# Patient Record
Sex: Female | Born: 1970 | Hispanic: Yes | Marital: Married | Smoking: Never smoker
Health system: Southern US, Community
[De-identification: ages and names within clinical notes are randomized; demographics above are authoritative.]

## PROBLEM LIST (undated history)

## (undated) DIAGNOSIS — E785 Hyperlipidemia, unspecified: Secondary | ICD-10-CM

## (undated) HISTORY — DX: Hyperlipidemia, unspecified: E78.5

---

## 2000-02-02 ENCOUNTER — Emergency Department (HOSPITAL_COMMUNITY): Admission: EM | Admit: 2000-02-02 | Discharge: 2000-02-03 | Payer: Self-pay

## 2000-11-13 ENCOUNTER — Ambulatory Visit (HOSPITAL_COMMUNITY): Admission: RE | Admit: 2000-11-13 | Discharge: 2000-11-13 | Payer: Self-pay | Admitting: *Deleted

## 2002-08-24 ENCOUNTER — Encounter (INDEPENDENT_AMBULATORY_CARE_PROVIDER_SITE_OTHER): Payer: Self-pay | Admitting: *Deleted

## 2002-08-24 LAB — CONVERTED CEMR LAB

## 2002-08-28 ENCOUNTER — Encounter: Admission: RE | Admit: 2002-08-28 | Discharge: 2002-08-28 | Payer: Self-pay | Admitting: Family Medicine

## 2002-09-04 ENCOUNTER — Encounter: Admission: RE | Admit: 2002-09-04 | Discharge: 2002-09-04 | Payer: Self-pay | Admitting: Family Medicine

## 2002-09-12 ENCOUNTER — Ambulatory Visit (HOSPITAL_COMMUNITY): Admission: RE | Admit: 2002-09-12 | Discharge: 2002-09-12 | Payer: Self-pay | Admitting: *Deleted

## 2002-10-15 ENCOUNTER — Encounter: Admission: RE | Admit: 2002-10-15 | Discharge: 2002-10-15 | Payer: Self-pay | Admitting: Family Medicine

## 2002-11-14 ENCOUNTER — Encounter: Admission: RE | Admit: 2002-11-14 | Discharge: 2002-11-14 | Payer: Self-pay | Admitting: Family Medicine

## 2002-12-13 ENCOUNTER — Encounter: Admission: RE | Admit: 2002-12-13 | Discharge: 2002-12-13 | Payer: Self-pay | Admitting: Family Medicine

## 2003-01-02 ENCOUNTER — Encounter: Admission: RE | Admit: 2003-01-02 | Discharge: 2003-01-02 | Payer: Self-pay | Admitting: Sports Medicine

## 2003-01-14 ENCOUNTER — Encounter: Admission: RE | Admit: 2003-01-14 | Discharge: 2003-01-14 | Payer: Self-pay | Admitting: Family Medicine

## 2003-01-19 ENCOUNTER — Inpatient Hospital Stay (HOSPITAL_COMMUNITY): Admission: AD | Admit: 2003-01-19 | Discharge: 2003-01-21 | Payer: Self-pay | Admitting: Family Medicine

## 2006-06-23 ENCOUNTER — Encounter (INDEPENDENT_AMBULATORY_CARE_PROVIDER_SITE_OTHER): Payer: Self-pay | Admitting: *Deleted

## 2008-10-03 ENCOUNTER — Encounter: Payer: Self-pay | Admitting: Family Medicine

## 2008-10-03 ENCOUNTER — Ambulatory Visit (HOSPITAL_COMMUNITY): Admission: RE | Admit: 2008-10-03 | Discharge: 2008-10-03 | Payer: Self-pay | Admitting: Family Medicine

## 2009-01-15 ENCOUNTER — Inpatient Hospital Stay (HOSPITAL_COMMUNITY): Admission: AD | Admit: 2009-01-15 | Discharge: 2009-01-15 | Payer: Self-pay | Admitting: Obstetrics & Gynecology

## 2009-02-16 ENCOUNTER — Inpatient Hospital Stay (HOSPITAL_COMMUNITY): Admission: AD | Admit: 2009-02-16 | Discharge: 2009-02-16 | Payer: Self-pay | Admitting: Obstetrics & Gynecology

## 2009-02-16 ENCOUNTER — Ambulatory Visit: Payer: Self-pay | Admitting: Family Medicine

## 2009-02-26 ENCOUNTER — Inpatient Hospital Stay (HOSPITAL_COMMUNITY): Admission: AD | Admit: 2009-02-26 | Discharge: 2009-02-27 | Payer: Self-pay | Admitting: Obstetrics & Gynecology

## 2009-02-26 ENCOUNTER — Ambulatory Visit: Payer: Self-pay | Admitting: Obstetrics and Gynecology

## 2010-07-28 LAB — CBC
Hemoglobin: 10.8 g/dL — ABNORMAL LOW (ref 12.0–15.0)
Hemoglobin: 12.7 g/dL (ref 12.0–15.0)
MCV: 85 fL (ref 78.0–100.0)
RBC: 3.79 MIL/uL — ABNORMAL LOW (ref 3.87–5.11)
RBC: 4.59 MIL/uL (ref 3.87–5.11)
RDW: 15.6 % — ABNORMAL HIGH (ref 11.5–15.5)
WBC: 11.2 10*3/uL — ABNORMAL HIGH (ref 4.0–10.5)
WBC: 11.4 10*3/uL — ABNORMAL HIGH (ref 4.0–10.5)

## 2010-07-30 LAB — GC/CHLAMYDIA PROBE AMP, GENITAL: Chlamydia, DNA Probe: NEGATIVE

## 2010-07-30 LAB — STREP B DNA PROBE

## 2010-07-30 LAB — URINALYSIS, ROUTINE W REFLEX MICROSCOPIC
Bilirubin Urine: NEGATIVE
Nitrite: NEGATIVE
Specific Gravity, Urine: <1.005 — ABNORMAL LOW (ref 1.005–1.030)
pH: 6.5 (ref 5.0–8.0)

## 2010-07-30 LAB — WET PREP, GENITAL: Yeast Wet Prep HPF POC: NONE SEEN

## 2010-07-30 LAB — FETAL FIBRONECTIN: Fetal Fibronectin: NEGATIVE

## 2012-07-13 ENCOUNTER — Encounter (HOSPITAL_COMMUNITY): Payer: Self-pay

## 2014-09-24 ENCOUNTER — Other Ambulatory Visit (HOSPITAL_COMMUNITY): Payer: Self-pay | Admitting: Nurse Practitioner

## 2014-09-24 DIAGNOSIS — Z1231 Encounter for screening mammogram for malignant neoplasm of breast: Secondary | ICD-10-CM

## 2014-10-06 ENCOUNTER — Ambulatory Visit (HOSPITAL_COMMUNITY)
Admission: RE | Admit: 2014-10-06 | Discharge: 2014-10-06 | Disposition: A | Discharge status: Home / Self Care | Payer: Self-pay | Source: Ambulatory Visit | Attending: Nurse Practitioner | Admitting: Nurse Practitioner

## 2014-10-06 DIAGNOSIS — Z1231 Encounter for screening mammogram for malignant neoplasm of breast: Secondary | ICD-10-CM

## 2015-11-11 ENCOUNTER — Other Ambulatory Visit: Payer: Self-pay | Admitting: Nurse Practitioner

## 2015-11-11 DIAGNOSIS — Z1231 Encounter for screening mammogram for malignant neoplasm of breast: Secondary | ICD-10-CM

## 2015-11-12 ENCOUNTER — Ambulatory Visit
Admission: RE | Admit: 2015-11-12 | Discharge: 2015-11-12 | Disposition: A | Discharge status: Home / Self Care | Payer: No Typology Code available for payment source | Source: Ambulatory Visit | Attending: Nurse Practitioner | Admitting: Nurse Practitioner

## 2015-11-12 DIAGNOSIS — Z1231 Encounter for screening mammogram for malignant neoplasm of breast: Secondary | ICD-10-CM

## 2016-09-30 ENCOUNTER — Other Ambulatory Visit: Payer: Self-pay | Admitting: Nurse Practitioner

## 2016-09-30 DIAGNOSIS — Z1231 Encounter for screening mammogram for malignant neoplasm of breast: Secondary | ICD-10-CM

## 2016-11-17 ENCOUNTER — Ambulatory Visit
Admission: RE | Admit: 2016-11-17 | Discharge: 2016-11-17 | Disposition: A | Discharge status: Home / Self Care | Payer: No Typology Code available for payment source | Source: Ambulatory Visit | Attending: Nurse Practitioner | Admitting: Nurse Practitioner

## 2016-11-17 DIAGNOSIS — Z1231 Encounter for screening mammogram for malignant neoplasm of breast: Secondary | ICD-10-CM

## 2017-10-10 ENCOUNTER — Other Ambulatory Visit: Payer: Self-pay | Admitting: Nurse Practitioner

## 2017-10-10 DIAGNOSIS — Z1231 Encounter for screening mammogram for malignant neoplasm of breast: Secondary | ICD-10-CM

## 2017-11-10 ENCOUNTER — Other Ambulatory Visit: Payer: Self-pay | Admitting: Obstetrics and Gynecology

## 2017-11-10 DIAGNOSIS — Z1231 Encounter for screening mammogram for malignant neoplasm of breast: Secondary | ICD-10-CM

## 2017-11-16 ENCOUNTER — Ambulatory Visit: Payer: Self-pay | Admitting: Internal Medicine

## 2017-12-12 ENCOUNTER — Encounter (HOSPITAL_COMMUNITY): Payer: Self-pay

## 2017-12-12 ENCOUNTER — Ambulatory Visit
Admission: RE | Admit: 2017-12-12 | Discharge: 2017-12-12 | Disposition: A | Discharge status: Home / Self Care | Payer: No Typology Code available for payment source | Source: Ambulatory Visit | Attending: Obstetrics and Gynecology | Admitting: Obstetrics and Gynecology

## 2017-12-12 ENCOUNTER — Ambulatory Visit (HOSPITAL_COMMUNITY)
Admission: RE | Admit: 2017-12-12 | Discharge: 2017-12-12 | Disposition: A | Discharge status: Home / Self Care | Payer: No Typology Code available for payment source | Source: Ambulatory Visit | Attending: Obstetrics and Gynecology | Admitting: Obstetrics and Gynecology

## 2017-12-12 VITALS — BP 118/72 | Ht 59.0 in | Wt 136.0 lb

## 2017-12-12 DIAGNOSIS — Z01419 Encounter for gynecological examination (general) (routine) without abnormal findings: Secondary | ICD-10-CM

## 2017-12-12 DIAGNOSIS — Z1231 Encounter for screening mammogram for malignant neoplasm of breast: Secondary | ICD-10-CM

## 2017-12-12 NOTE — Progress Notes (Signed)
No complaints today.   Pap Smear: Pap smear completed today. Last Pap [facility] and normal. Per patient has no history of an abnormal Pap smear. Last Pap smear result is in Epic.  Physical exam: Breasts Breasts symmetrical. No skin abnormalities bilateral breasts. No nipple retraction bilateral breasts. No nipple discharge bilateral breasts. No lymphadenopathy. No lumps palpated bilateral breasts. No complaints of pain or tenderness on exam. [facility] for [facility] mamm[facility]. Appointment scheduled for Tuesday, December 12, 2017 at 1110.        Pelvic/Bimanual   Ext Genitalia No lesions, no swelling and no discharge observed on external genitalia.         Vagina Vagina pink and normal texture. No lesions or discharge observed in vagina.          Cervix Cervix is present. Cervix pink and of normal texture. No discharge observed.     Uterus Uterus is present and palpable. Uterus in normal position and normal size.        Adnexae Bilateral ovaries present and palpable. No tenderness on palpation.         Rectovaginal No rectal exam completed today since patient had no rectal complaints. No skin abnormalities observed on exam.    Smoking History: Patient has never smoked.  Patient Navigation: Patient education provided. Access to services provided for patient through [facility] program.   Breast and Cervical Cancer Risk Assessment: Patient has no family history of breast cancer, known genetic mutations, or radiation treatment to the chest before age 45. Patient has no history of cervical dysplasia, immunocompromised, or DES exposure in-utero.  Risk Assessment    Risk Scores      12/12/2017   Last edited by: Priscille HeidelbergBrannock, Shaquela Weichert P, RN   5-year risk: 0.5 %   Lifetime risk: 6.2 %

## 2017-12-12 NOTE — Patient Instructions (Signed)
Explained breast self awareness with Missy Sabinsosa H Wallen. Let patient know [facility] will cover Pap smears and HPV typing every 5 years unless has a history of abnormal Pap smears. [facility]. Appointment scheduled for Tuesday, December 12, 2017 at 1110. Let patient know will follow up with her within the next couple weeks with results of Pap smear by letter or phone. Informed patient the the Breast Center will follow-up with her within the next couple of weeks with results of mammogram by letter or phone. [employer] verbalized understanding.  Takerra Lupinacci, Kathaleen Maserhristine Poll, RN 1:00 PM

## 2017-12-13 ENCOUNTER — Encounter (HOSPITAL_COMMUNITY): Payer: Self-pay | Admitting: *Deleted

## 2017-12-14 LAB — CYTOLOGY - PAP
DIAGNOSIS: NEGATIVE
HPV: NOT DETECTED

## 2017-12-15 ENCOUNTER — Ambulatory Visit: Payer: No Typology Code available for payment source

## 2017-12-15 ENCOUNTER — Other Ambulatory Visit: Payer: No Typology Code available for payment source

## 2017-12-15 ENCOUNTER — Other Ambulatory Visit (HOSPITAL_COMMUNITY): Payer: Self-pay | Admitting: *Deleted

## 2017-12-15 DIAGNOSIS — Z0000 Encounter for general adult medical examination without abnormal findings: Secondary | ICD-10-CM

## 2017-12-15 NOTE — Addendum Note (Signed)
Addended by: Ramey Schiff on: 12/15/2017 10:12 AM   Modules accepted: Orders  

## 2017-12-15 NOTE — Addendum Note (Signed)
Addended by: Kathreen CornfieldSMITH, Trivia Heffelfinger on: 12/15/2017 10:12 AM   Modules accepted: Orders

## 2017-12-29 ENCOUNTER — Encounter (HOSPITAL_COMMUNITY): Payer: Self-pay | Admitting: *Deleted

## 2017-12-29 NOTE — Progress Notes (Signed)
Letter mailed to patient with negative pap smear results. HPV was negative. Next pap smear due in five years. 

## 2018-06-08 ENCOUNTER — Other Ambulatory Visit (HOSPITAL_COMMUNITY): Payer: Self-pay | Admitting: *Deleted

## 2018-06-08 DIAGNOSIS — Z136 Encounter for screening for cardiovascular disorders: Secondary | ICD-10-CM

## 2018-06-14 ENCOUNTER — Ambulatory Visit (HOSPITAL_COMMUNITY): Admission: RE | Admit: 2018-06-14 | Payer: No Typology Code available for payment source | Source: Ambulatory Visit

## 2018-12-14 IMAGING — MG DIGITAL SCREENING BILATERAL MAMMOGRAM WITH TOMO AND CAD
8 series · 9 of 24 positions shown · non-contrast
Comparison: Previous exam(s).

CLINICAL DATA: Screening.

EXAM:
DIGITAL SCREENING BILATERAL MAMMOGRAM WITH TOMO AND CAD

[R MLO synth-2D]
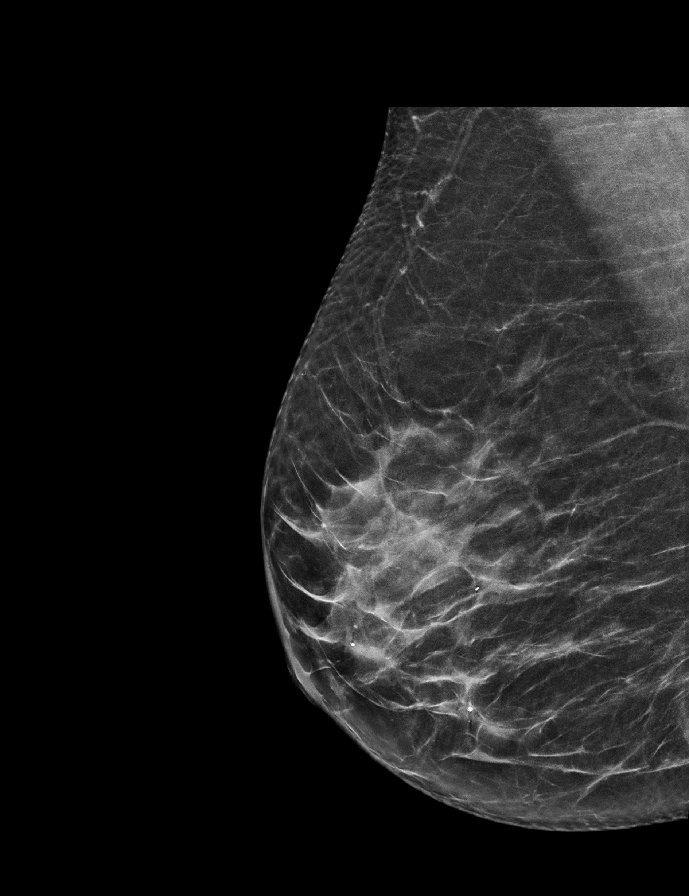

[R CC synth-2D]
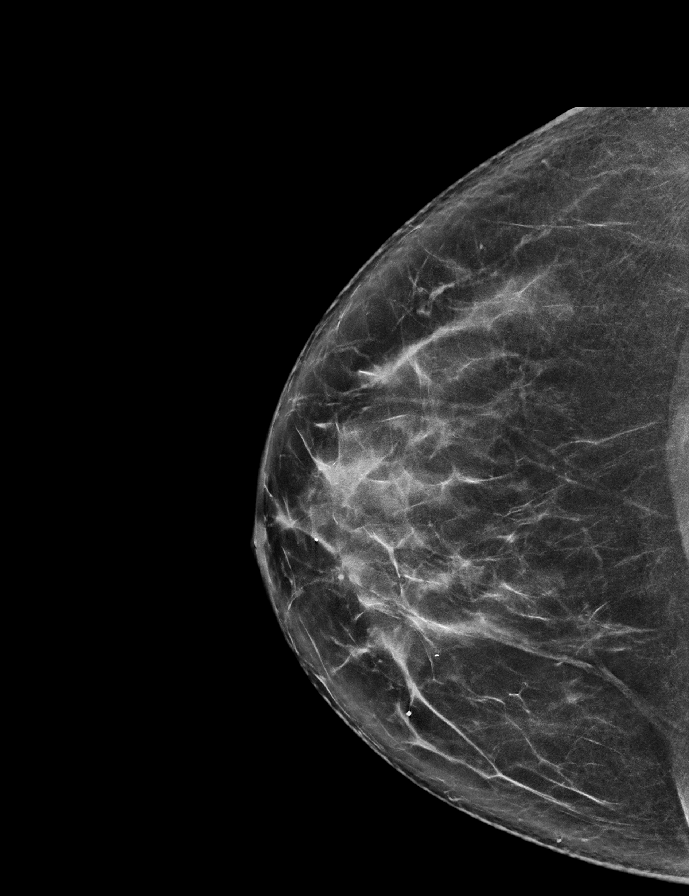

[L MLO synth-2D]
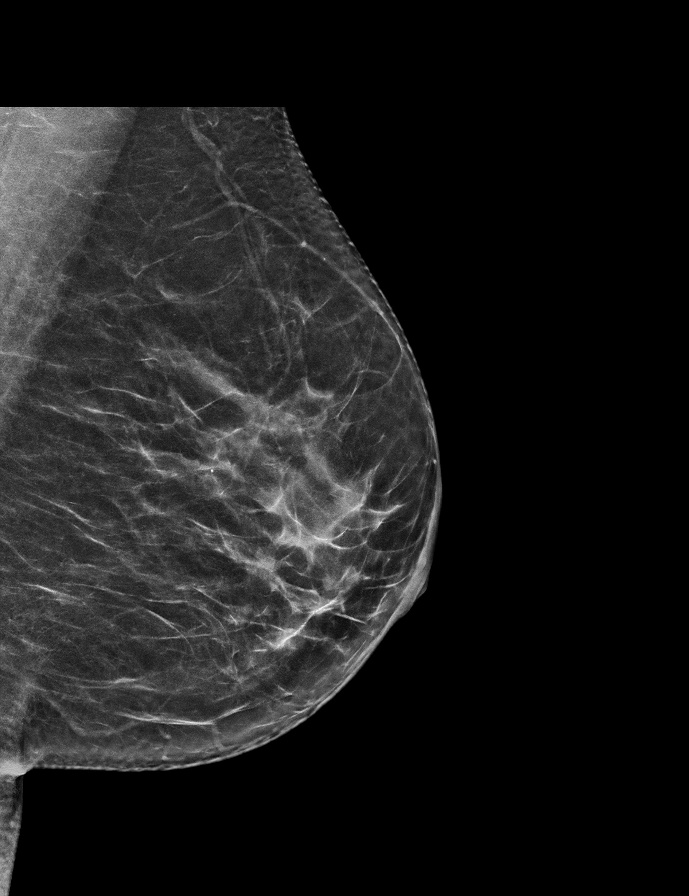

[L CC synth-2D]
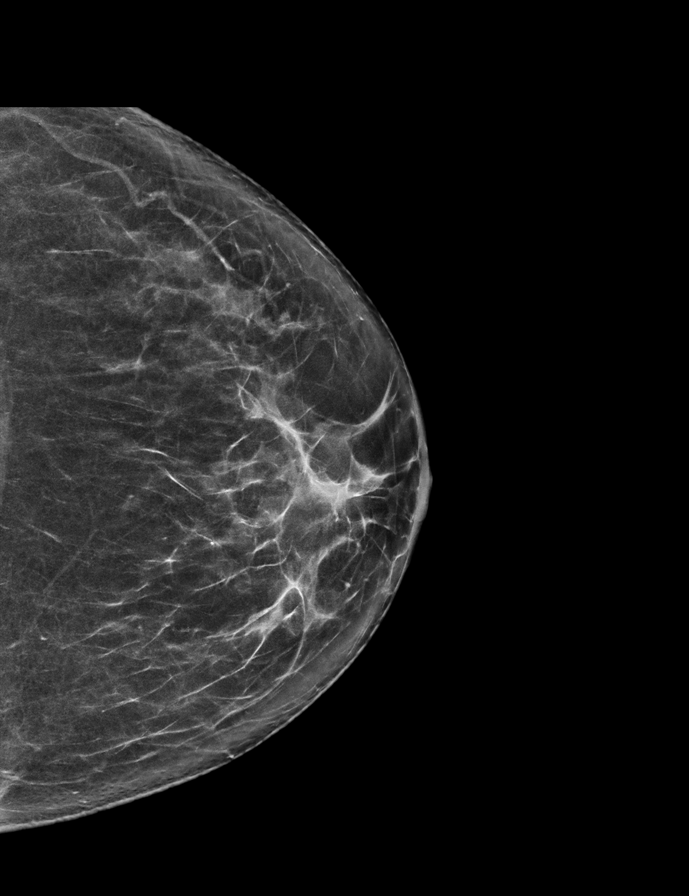

[R MLO tomo · 2 of 64 frames shown]
[frame 21/64]
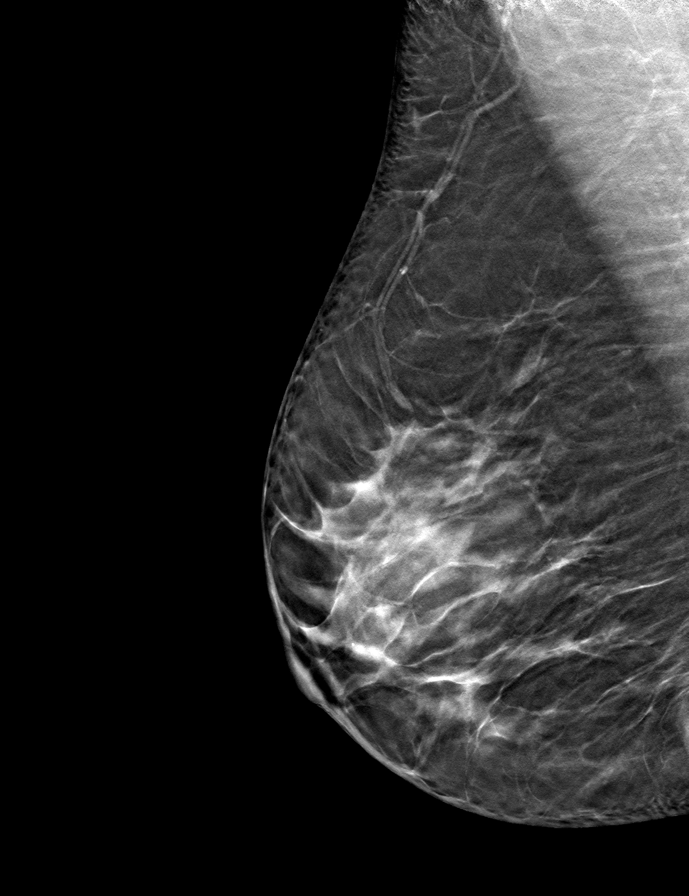
[frame 33/64]
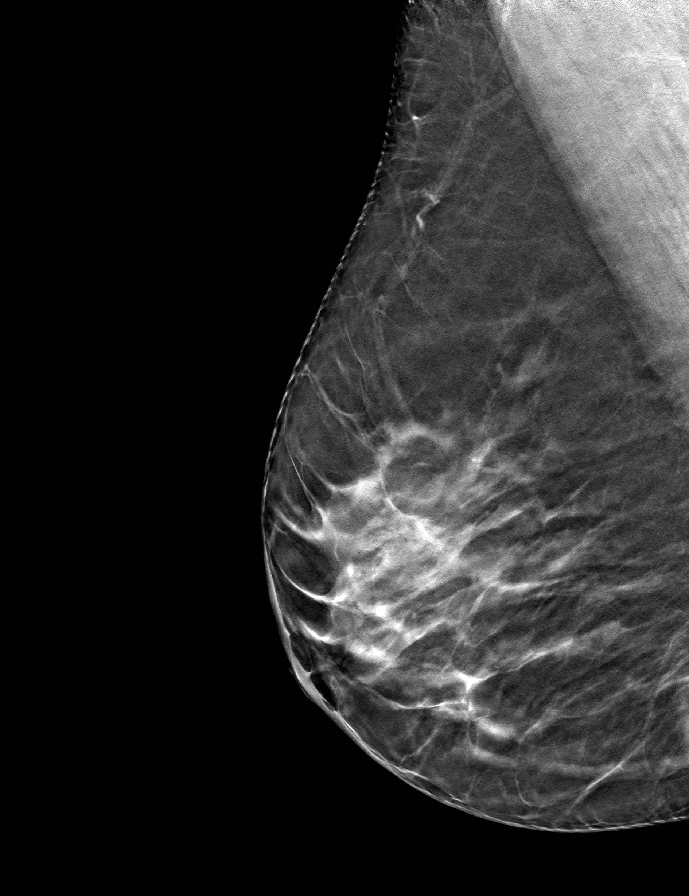

[L MLO tomo · tomo slice 34/67.0]
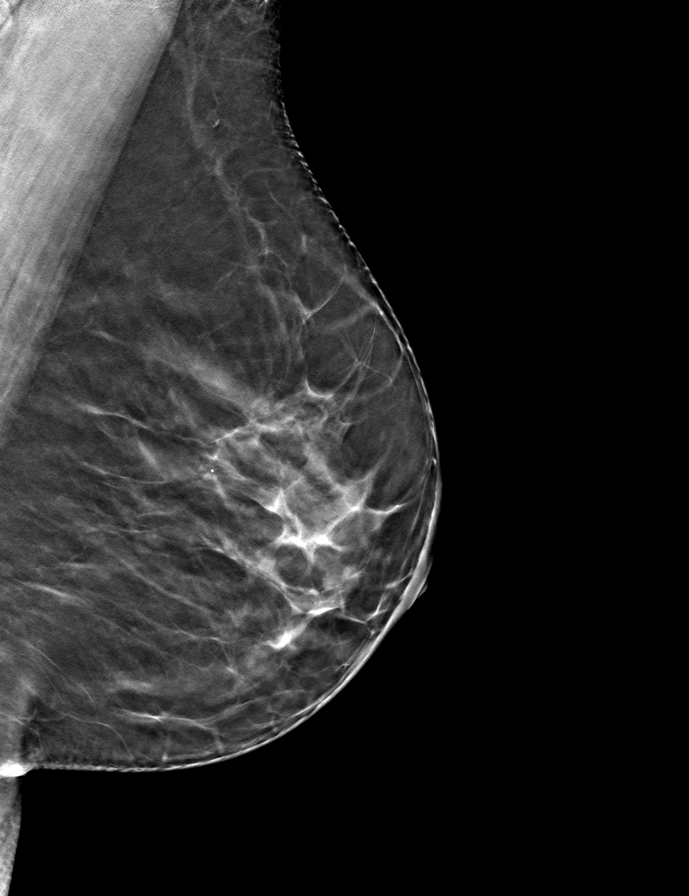

[R CC tomo · tomo slice 35/70.0]
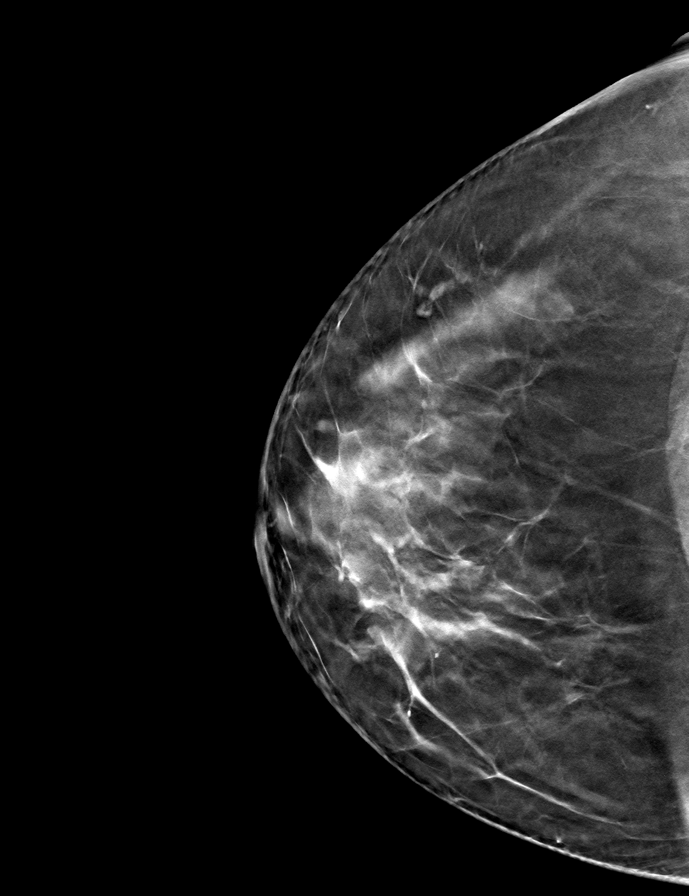

[L CC tomo · tomo slice 34/67.0]
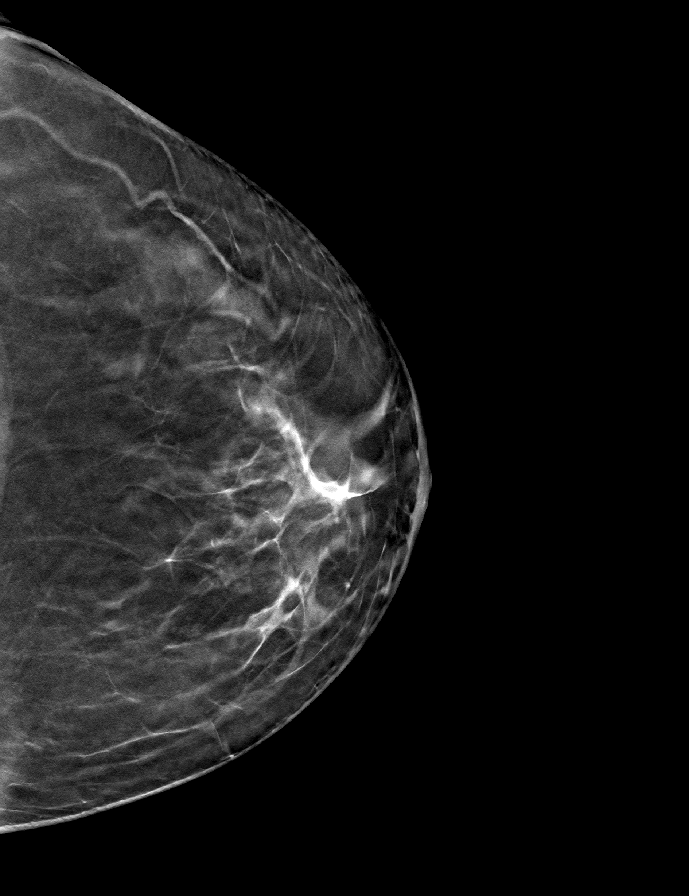

[9 of 24 positions shown; findings below may reference images not displayed]

ACR Breast Density Category c: The breast tissue is heterogeneously
dense, which may obscure small masses.
FINDINGS: There are no findings suspicious for malignancy. Images were
processed with CAD.
IMPRESSION: No mammographic evidence of malignancy. A result letter of this
screening mammogram will be mailed directly to the patient.

RECOMMENDATION:
Screening mammogram in one year. (Code:FT-U-LHB)

BI-RADS CATEGORY  1: Negative.

## 2020-01-29 ENCOUNTER — Ambulatory Visit: Payer: Self-pay | Admitting: Internal Medicine

## 2020-01-29 ENCOUNTER — Encounter: Payer: Self-pay | Admitting: Internal Medicine

## 2020-01-29 VITALS — BP 116/84 | HR 60 | Resp 12 | Ht <= 58 in | Wt 136.0 lb

## 2020-01-29 DIAGNOSIS — L290 Pruritus ani: Secondary | ICD-10-CM

## 2020-01-29 DIAGNOSIS — Z1231 Encounter for screening mammogram for malignant neoplasm of breast: Secondary | ICD-10-CM

## 2020-01-29 DIAGNOSIS — R1013 Epigastric pain: Secondary | ICD-10-CM

## 2020-01-29 MED ORDER — TRIAMCINOLONE ACETONIDE 0.1 % EX CREA: 1.0000 "application " | TOPICAL_CREAM | Freq: Two times a day (BID) | CUTANEOUS | 1 refills | Status: DC

## 2020-01-29 MED ORDER — DICYCLOMINE HCL 20 MG PO TABS: ORAL_TABLET | ORAL | 6 refills | Status: DC

## 2020-01-29 MED ORDER — DICYCLOMINE HCL 20 MG PO TABS: ORAL_TABLET | ORAL | 6 refills | Status: AC

## 2020-01-29 MED ORDER — TRIAMCINOLONE ACETONIDE 0.1 % EX CREA: 1.0000 "application " | TOPICAL_CREAM | Freq: Two times a day (BID) | CUTANEOUS | 1 refills | Status: AC

## 2020-01-29 NOTE — Progress Notes (Signed)
Subjective:    Patient ID: Tara Mendez, female   DOB: 04-05-1971, 49 y.o.   MRN: [id number]   HPI   Here to establish Mother is a patient here  1.  Anal itching started about 3 months ago.  She thinks she used some sort of cortisone cream.  No bleeding.  No increased moistness.  She has noted itching also on the insides of her upper thighs. She tries to wear cotton underwear, but sometimes uses a polyester blend.   Does not feel she wears tight clothing. No vaginal discharge.   Uses organic pads for periods.  2.  Abdominal pain with eating at times. Has been occurring for a year or more. Bloating and pain that starts in right and left flank and then wraps around upper abdomen.  Generally about 1 hour after eating.  Sometimes makes herself vomit to feel better.  Can be nauseated.  No diarrhea or constipation.  If she can burp, she does note improvement.  Will often use alka seltzer.  Not clear that helps or if resolves on own.   Can have once weekly, sometimes more frequently.   No melena or hematochezia.   Last episode was yesterday.       No outpatient medications have been marked as taking for the 01/29/20 encounter (Office Visit) with Julieanne Manson, MD.   No Known Allergies  History reviewed. No pertinent past medical history.  History reviewed. No pertinent surgical history.  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   79 yo daughter is married   [place]   Social Determinants of Health   Financial Resource Strain: Medium Risk   Difficulty of Paying Living Expenses: Somewhat hard  Food Insecurity: No Food Insecurity   Worried About Programme researcher, broadcasting/film/video in the Last Year: Never true   Ran Out of Food in the Last Year: Never true  Transportation Needs: No Transportation Needs   Lack of Transportation (Medical): No   Lack of Transportation (Non-Medical): No  Physical Activity: Not on file  Stress: No Stress Concern Present   Feeling of Stress : Not at all  Social Connections: Moderately Integrated   Frequency of Communication with Friends and Family: Three times a week   Frequency of Social Gatherings with Friends and Family: Once a week   Attends Religious Services: More than 4 times per year   Active Member of [employer] or Organizations: No   Attends Banker Meetings: Never  Marital Status: Married  Catering manager Violence: Not At Risk   Fear of Current or Ex-Partner: No   Emotionally Abused: No   Physically Abused: No   Sexually Abused: No      Review of Systems    Objective:   BP 116/84 (BP Location: Right Arm, Patient Position: Sitting, Cuff Size: Normal)   Pulse 60   Resp 12   Ht 4' 9.5" (1.461 m)   Wt 136 lb (61.7 kg)   LMP 01/29/2020 (Exact Date)   BMI 28.92 kg/m   Physical Exam  NAD Burping throughout history and physical HEENT:  PERRL, EOMI, TMs pearly gray, throat without injection. Neck:  Supple, No adenopathy, no thyromegaly Chest:  CTA CV:  RRR with normal S1 and S2, No S3, S4 or murmur.  No carotid bruits.  Carotid, radial and DP pulses normal and equal. Abd:  S, NT, No  HSM or mass, + BS Rectal:  mild superficial inflammation.  No exudate.  No external hemorrhoids.      Assessment & Plan   Anal itching:  likely pruritis ani.  Tucks pads to pat clean area after BM or when irritated.  Allow to dry and then apply Lotrimin 2% cream and Triamcinolone cream twice daily for 7 days and thereafter only when needed.  Call if does not control.  Avoid tight fitting pants /tights/pantyhose.   Cotton underwear.    2.  Postprandial generalized abdominal discomfort:  likely IBS:  Encouraged gradually increasing fiber with fiber laxative.  Dicyclomine up to twice daily as needed.    3.  HM:  mammogram ordered.  Fasting labs in 2 weeks.  CPE without pap in 4 months. Flu vaccine RX

## 2020-01-29 NOTE — Patient Instructions (Addendum)
Tucks pads or Witch Hazel soaked makeup remover pads--after bowel movements or whenever irritated--pat area  And then allow to dry.  Do not wipe aggressively after bowel movements--recommend patting clean with moist wash cloth and then using the Tucks--allow to dry and then apply  Lotrimin (Miconazole) 2% and Triamcinolone cream 0.1%.  Use the creams twice daily for about 7 days and then stop and use only as needed thereafter.  Once your period is through, try to go without underwear at bedtime  Cotton underwear only.  Wear loose pants or skirts/dresses.  For abdominal bloating and pain:  Dicyclomine up to twice daily as needed.   Also get Metamucil or Citrucel and start with half dose daily and increase gradually to whole dose over 2-4 weeks. Drink lots of water.

## 2020-01-29 NOTE — Progress Notes (Signed)
Social Work Intern met with new patient who is scheduled with Dr. Mulberry for medical visit. Social worker completed New Patient Questionnaire which included completion of housing, intimate partner violence, transportation needs, stress, financial resource strain, food insecurity and screeners. Social History   Socioeconomic History  . Marital status: Married    Spouse name: Not on file  . Number of children: Not on file  . Years of education: Not on file  . Highest education level: Not on file  Occupational History  . Not on file  Tobacco Use  . Smoking status: Never Smoker  . Smokeless tobacco: Never Used  Vaping Use  . Vaping Use: Never used  Substance and Sexual Activity  . Alcohol use: Not Currently  . Drug use: Never  . Sexual activity: Yes    Birth control/protection: Pill  Other Topics Concern  . Not on file  Social History Narrative  . Not on file   Social Determinants of Health   Financial Resource Strain: Medium Risk  . Difficulty of Paying Living Expenses: Somewhat hard  Food Insecurity: No Food Insecurity  . Worried About Running Out of Food in the Last Year: Never true  . Ran Out of Food in the Last Year: Never true  Transportation Needs: No Transportation Needs  . Lack of Transportation (Medical): No  . Lack of Transportation (Non-Medical): No  Physical Activity:   . Days of Exercise per Week: Not on file  . Minutes of Exercise per Session: Not on file  Stress: No Stress Concern Present  . Feeling of Stress : Not at all  Social Connections: Moderately Integrated  . Frequency of Communication with Friends and Family: Three times a week  . Frequency of Social Gatherings with Friends and Family: Once a week  . Attends Religious Services: More than 4 times per year  . Active Member of Clubs or Organizations: No  . Attends Club or Organization Meetings: Never  . Marital Status: Married    Depression screen PHQ 2/9 01/29/2020  Decreased Interest 0  Down,  Depressed, Hopeless 0  PHQ - 2 Score 0  Altered sleeping 0  Tired, decreased energy 1  Change in appetite 0  Feeling bad or failure about yourself  0  Trouble concentrating 0  Moving slowly or fidgety/restless 0  Suicidal thoughts 0  PHQ-9 Score 1    GAD 7 : Generalized Anxiety Score 01/29/2020  Nervous, Anxious, on Edge 0  Control/stop worrying 0  Worry too much - different things 0  Trouble relaxing 0  Restless 0  Easily annoyed or irritable 0  Afraid - awful might happen 0  Total GAD 7 Score 0  Anxiety Difficulty Not difficult at all     Based on presentation Client can seek counseling if she sees a need for it in the future. Patient stated that her stress comes from taking care of her mother and her 3 children. Patient also mentioned that finances are difficult sometimes, but it is manageable. Social Work Intern provided patient with information about counseling and gave patient a contact card. Patient thanked Social Work intern for card. Rachel Dzik, MSW Intern   

## 2020-02-17 ENCOUNTER — Other Ambulatory Visit: Payer: Self-pay | Admitting: Obstetrics & Gynecology

## 2020-02-17 DIAGNOSIS — Z1231 Encounter for screening mammogram for malignant neoplasm of breast: Secondary | ICD-10-CM

## 2020-02-21 ENCOUNTER — Other Ambulatory Visit: Payer: Self-pay

## 2020-02-27 ENCOUNTER — Ambulatory Visit: Payer: No Typology Code available for payment source

## 2020-02-27 ENCOUNTER — Other Ambulatory Visit: Payer: Self-pay

## 2020-02-28 ENCOUNTER — Other Ambulatory Visit: Payer: Self-pay

## 2020-02-28 DIAGNOSIS — Z13220 Encounter for screening for lipoid disorders: Secondary | ICD-10-CM

## 2020-02-28 DIAGNOSIS — R109 Unspecified abdominal pain: Secondary | ICD-10-CM

## 2020-02-29 LAB — CBC WITH DIFFERENTIAL/PLATELET
Basophils Absolute: 0.1 10*3/uL (ref 0.0–0.2)
Basos: 1 %
EOS (ABSOLUTE): 0.1 10*3/uL (ref 0.0–0.4)
Eos: 1 %
Hematocrit: 38.9 % (ref 34.0–46.6)
Hemoglobin: 12.8 g/dL (ref 11.1–15.9)
Immature Grans (Abs): 0 10*3/uL (ref 0.0–0.1)
Immature Granulocytes: 0 %
Lymphocytes Absolute: 2.6 10*3/uL (ref 0.7–3.1)
Lymphs: 39 %
MCH: 26.6 pg (ref 26.6–33.0)
MCHC: 32.9 g/dL (ref 31.5–35.7)
MCV: 81 fL (ref 79–97)
Monocytes Absolute: 0.5 10*3/uL (ref 0.1–0.9)
Monocytes: 8 %
Neutrophils Absolute: 3.3 10*3/uL (ref 1.4–7.0)
Neutrophils: 51 %
Platelets: 357 10*3/uL (ref 150–450)
RBC: 4.82 x10E6/uL (ref 3.77–5.28)
RDW: 15.2 % (ref 11.7–15.4)
WBC: 6.6 10*3/uL (ref 3.4–10.8)

## 2020-02-29 LAB — COMPREHENSIVE METABOLIC PANEL
ALT: 28 IU/L (ref 0–32)
AST: 27 IU/L (ref 0–40)
Albumin/Globulin Ratio: 1.6 (ref 1.2–2.2)
Albumin: 4.4 g/dL (ref 3.8–4.8)
Alkaline Phosphatase: 91 IU/L (ref 44–121)
BUN/Creatinine Ratio: 10 (ref 9–23)
BUN: 7 mg/dL (ref 6–24)
Bilirubin Total: <0.2 mg/dL (ref 0.0–1.2)
CO2: 24 mmol/L (ref 20–29)
Calcium: 9.3 mg/dL (ref 8.7–10.2)
Chloride: 102 mmol/L (ref 96–106)
Creatinine, Ser: 0.7 mg/dL (ref 0.57–1.00)
GFR calc Af Amer: 119 mL/min/{1.73_m2} (ref >59)
GFR calc non Af Amer: 104 mL/min/{1.73_m2} (ref >59)
Globulin, Total: 2.8 g/dL (ref 1.5–4.5)
Glucose: 90 mg/dL (ref 65–99)
Potassium: 4.4 mmol/L (ref 3.5–5.2)
Sodium: 138 mmol/L (ref 134–144)
Total Protein: 7.2 g/dL (ref 6.0–8.5)

## 2020-02-29 LAB — LIPID PANEL W/O CHOL/HDL RATIO
Cholesterol, Total: 187 mg/dL (ref 100–199)
HDL: 34 mg/dL — ABNORMAL LOW (ref >39)
LDL Chol Calc (NIH): 113 mg/dL — ABNORMAL HIGH (ref 0–99)
Triglycerides: 228 mg/dL — ABNORMAL HIGH (ref 0–149)
VLDL Cholesterol Cal: 40 mg/dL (ref 5–40)

## 2020-04-28 ENCOUNTER — Ambulatory Visit: Payer: No Typology Code available for payment source

## 2020-05-01 ENCOUNTER — Other Ambulatory Visit (INDEPENDENT_AMBULATORY_CARE_PROVIDER_SITE_OTHER): Payer: Self-pay | Admitting: Internal Medicine

## 2020-05-01 ENCOUNTER — Other Ambulatory Visit: Payer: Self-pay

## 2020-05-01 DIAGNOSIS — R0989 Other specified symptoms and signs involving the circulatory and respiratory systems: Secondary | ICD-10-CM

## 2020-05-01 DIAGNOSIS — Z20822 Contact with and (suspected) exposure to covid-19: Secondary | ICD-10-CM

## 2020-05-01 DIAGNOSIS — U071 COVID-19: Secondary | ICD-10-CM

## 2020-05-01 LAB — POC COVID19 BINAXNOW: SARS Coronavirus 2 Ag: POSITIVE — AB

## 2020-05-01 NOTE — Progress Notes (Signed)
Patient here with her mother Vanecia Limpert. Evaluated outside with patient and other in car. All with URI symptoms, mother with fatigue and malaise. Brother with URI Symptoms at family gathering at their sister's house on Dec 25th.   Brother tested positive on Wednesday, Dec 29th. Mother started with symptoms on Dec. 30th Sister with symptoms for several days. Patient with mild runny nose for a couple of days.  Binax now rapid antigen testing positive today.  Discussed Isolation at home as well as from family members in home who are asymptomatic, already tested negative and wearing masks when needing to go into shared space such as kitchen.  To avoid sharing bathroom with those who are negative or asymptomatic and not tested. After 5 days if improved and no fever for 24 hours, may go out with N [address] or [address] masks--gave the car of 3 people a handful of masks to utilize.   To call if symptoms worsen.  If chest pain, worsening dyspnea in particular over the weekend, to be seen.

## 2020-06-01 ENCOUNTER — Encounter: Payer: Self-pay | Admitting: Internal Medicine

## 2020-10-27 ENCOUNTER — Encounter: Payer: Self-pay | Admitting: Internal Medicine

## 2020-12-07 DIAGNOSIS — R1013 Epigastric pain: Secondary | ICD-10-CM | POA: Insufficient documentation

## 2020-12-07 DIAGNOSIS — L290 Pruritus ani: Secondary | ICD-10-CM | POA: Insufficient documentation

## 2020-12-25 ENCOUNTER — Encounter: Payer: Self-pay | Admitting: Internal Medicine

## 2020-12-25 ENCOUNTER — Ambulatory Visit: Payer: Self-pay | Admitting: Internal Medicine

## 2020-12-25 ENCOUNTER — Other Ambulatory Visit: Payer: Self-pay

## 2020-12-25 ENCOUNTER — Other Ambulatory Visit: Payer: Self-pay | Admitting: Internal Medicine

## 2020-12-25 VITALS — BP 122/86 | HR 72 | Resp 10 | Ht <= 58 in | Wt 131.0 lb

## 2020-12-25 DIAGNOSIS — Z0000 Encounter for general adult medical examination without abnormal findings: Secondary | ICD-10-CM

## 2020-12-25 DIAGNOSIS — Z124 Encounter for screening for malignant neoplasm of cervix: Secondary | ICD-10-CM

## 2020-12-25 DIAGNOSIS — I83813 Varicose veins of bilateral lower extremities with pain: Secondary | ICD-10-CM

## 2020-12-25 DIAGNOSIS — Z1231 Encounter for screening mammogram for malignant neoplasm of breast: Secondary | ICD-10-CM

## 2020-12-25 DIAGNOSIS — Z23 Encounter for immunization: Secondary | ICD-10-CM

## 2020-12-25 NOTE — Progress Notes (Signed)
Subjective:    Patient ID: Tara Mendez, female   DOB: [birth year], 50 y.o.   MRN: [id number]   HPI  CPE with pap  1.  Pap:  Last 12/12/2017 and normal.    2.  Mammogram:  Last mammogram 8.20/2019 and normal.  No family history of breast cancer.   3.  Osteoprevention:  Willing to drink 2% milk 3-4 times daily.  Dances for physical activity 30 minutes daily.    4.  Guaiac Cards/FIT:  Never  5.  Colonoscopy:  Never.  No family history of colon cancer.   6.  Immunizations:   Immunization History  Administered Date(s) Administered   PFIZER(Purple Top)SARS-COV-2 Vaccination 11/19/2019, 12/10/2019     7.  Glucose/Cholesterol :  Fasting glucose fine in past, though does have dyslipidemia. Lipid Panel     Component Value Date/Time   CHOL 187 02/28/2020 1030   TRIG 228 (H) 02/28/2020 1030   HDL 34 (L) 02/28/2020 1030   LDLCALC 113 (H) 02/28/2020 1030   LABVLDL 40 02/28/2020 1030     Current Meds  Medication Sig   PRESCRIPTION MEDICATION Unknown BCP   triamcinolone cream (KENALOG) 0.1 % Apply 1 application topically 2 (two) times daily.   No Known Allergies  No past medical history on file.  No past surgical history on file.  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   71 yo daughter is married who lives in [address] from [place]   Social Determinants of Health   Financial Resource Strain: Low Risk    Difficulty of Paying Living Expenses: Not hard at all  Food Insecurity: No Food Insecurity   Worried About Programme researcher, broadcasting/film/video in the Last Year: Never true   Ran Out of Food in the Last Year: Never true  Transportation Needs: No Transportation Needs   Lack of Transportation (Medical): No   Lack of Transportation (Non-Medical): No  Physical Activity: Not on file  Stress: No Stress Concern Present   Feeling of Stress : Not at all  Social Connections: Moderately Integrated   Frequency of Communication with Friends and Family: Three times a week   Frequency of Social Gatherings with Friends and Family: Once a week   Attends Religious Services: More than 4 times per year   Active Member of [employer] or Organizations: No   Attends Banker Meetings: Never   Marital Status: Married  Catering manager Violence: Not At Risk   Fear  of Current or Ex-Partner: No   Emotionally Abused: No   Physically Abused: No   Sexually Abused: No      Review of Systems  Eyes:  Negative for visual disturbance.  Respiratory:  Negative for shortness of breath.   Cardiovascular:  Negative for chest pain, palpitations and leg swelling.  Gastrointestinal:  Negative for abdominal pain and blood in stool (No melena).  Musculoskeletal:        Calf pain bilaterally that is there all the time--like legs are very tired--as if she walked too much.  Worse when awakens in the morning and stretches.  Better after a hot shower.  States has been going on for 4-5 months.  Difficult history.  States using udder cream on legs and helps.  States her pain really started on lateral proximal midfoot as well.  Neurological:  Negative for weakness and  numbness.  Psychiatric/Behavioral:  Negative for dysphoric mood. The patient is not nervous/anxious.      Objective:   BP 122/86 (BP Location: Left Arm, Patient Position: Sitting, Cuff Size: Normal)   Pulse 72   Resp 10   Ht 4\' 10"  (1.473 m)   Wt 131 lb (59.4 kg)   LMP 12/20/2020   BMI 27.38 kg/m   Physical Exam Constitutional:      Appearance: Normal appearance.  HENT:     Head: Normocephalic and atraumatic.     Right Ear: Tympanic membrane, ear canal and external ear normal.     Left Ear: Tympanic membrane, ear canal and external ear normal.     Nose: Nose normal.     Mouth/Throat:     Mouth: Mucous membranes are moist.     Pharynx: Oropharynx is clear.  Eyes:     Extraocular Movements: Extraocular movements intact.     Conjunctiva/sclera: Conjunctivae normal.     Pupils: Pupils are equal, round, and reactive to light.     Comments: Discs sharp bilaterally.  Neck:     Thyroid: No thyroid mass or thyromegaly.  Cardiovascular:     Rate and Rhythm: Normal rate and regular rhythm.     Heart sounds: S1 normal and S2 normal. No murmur heard.   No friction rub. No S3 or S4 sounds.     Comments: No carotid bruits.  Carotid, radial, femoral, DP and PT pulses normal and equal.   Scattered broken superficial small veins of legs.  When legs dependent, does appear to have larger varicosities as well.   Pulmonary:     Effort: Pulmonary effort is normal.     Breath sounds: Normal breath sounds.  Chest:  Breasts:    Right: No inverted nipple, mass, nipple discharge or skin change.     Left: No inverted nipple, mass, nipple discharge or skin change.  Abdominal:     General: Bowel sounds are normal.     Palpations: Abdomen is soft. There is no hepatomegaly, splenomegaly or mass.     Tenderness: There is no abdominal tenderness.     Hernia: No hernia is present.  Genitourinary:    Comments: Normal external female genitalia No cervical lesions or vaginal discharge. No uterine  or adnexal mass or tenderness.  Musculoskeletal:        General: Normal range of motion.     Cervical back: Normal range of motion and neck supple.     Right lower leg: No edema.     Left lower leg: No edema.  Lymphadenopathy:     Head:  Right side of head: No submental or submandibular adenopathy.     Left side of head: No submental or submandibular adenopathy.     Cervical: No cervical adenopathy.     Upper Body:     Right upper body: No supraclavicular or axillary adenopathy.     Left upper body: No supraclavicular or axillary adenopathy.     Lower Body: No right inguinal adenopathy. No left inguinal adenopathy.  Skin:    General: Skin is warm.     Capillary Refill: Capillary refill takes less than 2 seconds.     Findings: No rash.  Neurological:     General: No focal deficit present.     Mental Status: She is alert and oriented to person, place, and time.     Cranial Nerves: Cranial nerves are intact.     Sensory: Sensation is intact.     Motor: Motor function is intact.     Coordination: Coordination is intact.     Gait: Gait is intact.     Deep Tendon Reflexes: Reflexes are normal and symmetric.  Psychiatric:        Attention and Perception: Attention normal.        Mood and Affect: Mood normal.        Speech: Speech normal.        Behavior: Behavior normal. Behavior is cooperative.        Thought Content: Thought content normal.        Judgment: Judgment normal.     Assessment & Plan   CPE with pap Mammogram ordered FIT to be returned when comes in for fasting labs in 3 weeks. Tdap and Pfizer COVID booster today Recommend influenza when available Sept/Oct Recommend bivalent COVID vaccine in 2 months.  2.  Calf pain:  likely due to varicosities that become more apparent when legs are dependent--in feet as well.  Encouraged elevation and reclining when sitting.  Order booklet for thigh high compression stockings given with instructions on measurements and  ordering.

## 2020-12-29 LAB — CYTOLOGY - PAP

## 2021-01-06 ENCOUNTER — Other Ambulatory Visit (INDEPENDENT_AMBULATORY_CARE_PROVIDER_SITE_OTHER): Payer: Self-pay

## 2021-01-06 DIAGNOSIS — Z1211 Encounter for screening for malignant neoplasm of colon: Secondary | ICD-10-CM

## 2021-01-06 LAB — POC FIT TEST STOOL: Fecal Occult Blood: NEGATIVE

## 2021-01-15 ENCOUNTER — Other Ambulatory Visit: Payer: Self-pay

## 2021-01-15 DIAGNOSIS — Z13220 Encounter for screening for lipoid disorders: Secondary | ICD-10-CM

## 2021-01-15 DIAGNOSIS — Z0000 Encounter for general adult medical examination without abnormal findings: Secondary | ICD-10-CM

## 2021-01-16 LAB — CBC WITH DIFFERENTIAL/PLATELET
Basophils Absolute: 0.1 10*3/uL (ref 0.0–0.2)
Basos: 1 %
EOS (ABSOLUTE): 0.1 10*3/uL (ref 0.0–0.4)
Eos: 1 %
Hematocrit: 40.2 % (ref 34.0–46.6)
Hemoglobin: 13 g/dL (ref 11.1–15.9)
Immature Grans (Abs): 0 10*3/uL (ref 0.0–0.1)
Immature Granulocytes: 0 %
Lymphocytes Absolute: 2 10*3/uL (ref 0.7–3.1)
Lymphs: 36 %
MCH: 27.3 pg (ref 26.6–33.0)
MCHC: 32.3 g/dL (ref 31.5–35.7)
MCV: 85 fL (ref 79–97)
Monocytes Absolute: 0.4 10*3/uL (ref 0.1–0.9)
Monocytes: 7 %
Neutrophils Absolute: 3.1 10*3/uL (ref 1.4–7.0)
Neutrophils: 55 %
Platelets: 374 10*3/uL (ref 150–450)
RBC: 4.76 x10E6/uL (ref 3.77–5.28)
RDW: 14.5 % (ref 11.7–15.4)
WBC: 5.7 10*3/uL (ref 3.4–10.8)

## 2021-01-16 LAB — COMPREHENSIVE METABOLIC PANEL
ALT: 14 IU/L (ref 0–32)
AST: 17 IU/L (ref 0–40)
Albumin/Globulin Ratio: 1.6 (ref 1.2–2.2)
Albumin: 4.1 g/dL (ref 3.8–4.8)
Alkaline Phosphatase: 73 IU/L (ref 44–121)
BUN/Creatinine Ratio: 11 (ref 9–23)
BUN: 8 mg/dL (ref 6–24)
Bilirubin Total: <0.2 mg/dL (ref 0.0–1.2)
CO2: 21 mmol/L (ref 20–29)
Calcium: 9.2 mg/dL (ref 8.7–10.2)
Chloride: 105 mmol/L (ref 96–106)
Creatinine, Ser: 0.76 mg/dL (ref 0.57–1.00)
Globulin, Total: 2.6 g/dL (ref 1.5–4.5)
Glucose: 94 mg/dL (ref 65–99)
Potassium: 4.6 mmol/L (ref 3.5–5.2)
Sodium: 141 mmol/L (ref 134–144)
Total Protein: 6.7 g/dL (ref 6.0–8.5)
eGFR: 97 mL/min/{1.73_m2} (ref >59)

## 2021-01-16 LAB — LIPID PANEL W/O CHOL/HDL RATIO
Cholesterol, Total: 159 mg/dL (ref 100–199)
HDL: 35 mg/dL — ABNORMAL LOW (ref >39)
LDL Chol Calc (NIH): 102 mg/dL — ABNORMAL HIGH (ref 0–99)
Triglycerides: 118 mg/dL (ref 0–149)
VLDL Cholesterol Cal: 22 mg/dL (ref 5–40)

## 2021-12-28 ENCOUNTER — Encounter: Payer: Self-pay | Admitting: Internal Medicine

## 2022-04-04 ENCOUNTER — Encounter: Payer: Self-pay | Admitting: Internal Medicine

## 2022-05-26 DIAGNOSIS — R7303 Prediabetes: Secondary | ICD-10-CM | POA: Insufficient documentation

## 2022-05-26 HISTORY — DX: Prediabetes: R73.03

## 2022-06-15 ENCOUNTER — Ambulatory Visit: Payer: BLUE CROSS/BLUE SHIELD | Admitting: Internal Medicine

## 2022-06-15 ENCOUNTER — Encounter: Payer: Self-pay | Admitting: Internal Medicine

## 2022-06-15 VITALS — BP 120/80 | HR 80 | Resp 16 | Ht <= 58 in | Wt 127.0 lb

## 2022-06-15 DIAGNOSIS — Z1231 Encounter for screening mammogram for malignant neoplasm of breast: Secondary | ICD-10-CM

## 2022-06-15 DIAGNOSIS — E785 Hyperlipidemia, unspecified: Secondary | ICD-10-CM

## 2022-06-15 DIAGNOSIS — Z833 Family history of diabetes mellitus: Secondary | ICD-10-CM

## 2022-06-15 DIAGNOSIS — Z0000 Encounter for general adult medical examination without abnormal findings: Secondary | ICD-10-CM

## 2022-06-15 LAB — POCT URINALYSIS DIPSTICK
Bilirubin, UA: NEGATIVE
Blood, UA: NEGATIVE
Glucose, UA: NEGATIVE
Ketones, UA: NEGATIVE
Leukocytes, UA: NEGATIVE
Nitrite, UA: NEGATIVE
Protein, UA: NEGATIVE
Spec Grav, UA: 1.01 (ref 1.010–1.025)
Urobilinogen, UA: 0.2 E.U./dL
pH, UA: 6 (ref 5.0–8.0)

## 2022-06-15 NOTE — Progress Notes (Unsigned)
Subjective:    Patient ID: Tara Mendez, female   DOB: 1970-09-15, 52 y.o.   MRN: [id number]   HPI  CPE without pap  1.  Pap:  Last in 2022 with endometrial cells and benign reparative changes.  Was finishing period at the time.    2.  Mammogram:  Last in 2019 and normal.  Always normal.  No family history of breast cancer.    3.  Osteoprevention:  Cheese once daily.  Occasional milk.  Willing to increase to 3 times daily dairy.  Dances for 1/2 hour daily.   4.  Guaiac Cards/FIT:  Negative last in 12/2020  5.  Colonoscopy:  never.  No family history of colon cancer.   6.  Immunizations:  Thinks she obtained influenza with her mother here with [facility].  Has not had COVID booster yet as well.    7.  Glucose/Cholesterol:  History of dyslipidemia with low HDL.  Glucose always in normal range. Lipid Panel     Component Value Date/Time   CHOL 159 01/15/2021 0955   TRIG 118 01/15/2021 0955   HDL 35 (L) 01/15/2021 0955   LDLCALC 102 (H) 01/15/2021 0955   LABVLDL 22 01/15/2021 0955     No outpatient medications have been marked as taking for the 06/15/22 encounter (Office Visit) with Mack Hook, MD.   No Known Allergies  Past Medical History:  Diagnosis Date   Dyslipidemia    History reviewed. No pertinent surgical history.  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   Eldest daughter is married who lives in [place]   Originally from [place].   Social Determinants of Health   Financial Resource Strain: Low Risk  (06/15/2022)   Overall Financial Resource Strain (CARDIA)    Difficulty of Paying Living Expenses: Not hard at all  Food Insecurity: No Food Insecurity (06/15/2022)   Hunger Vital Sign    Worried About Running Out of Food in the Last Year: Never true    Ran Out of Food in the Last Year: Never true  Transportation Needs: No Transportation Needs (01/29/2020)   PRAPARE - Hydrologist (Medical): No    Lack of Transportation (Non-Medical): No  Physical Activity: Not on  file  Stress: No Stress Concern Present (01/29/2020)   [place]    Feeling of Stress : Not at all  Social Connections: Moderately Integrated (01/29/2020)   Social Connection and Isolation Panel [NHANES]    Frequency of Communication with Friends and Family: Three times a week    Frequency of Social Gatherings with Friends and Family: Once a week    Attends Religious Services: More than 4 times per year    Active Member of [employer] or Organizations: No    Attends Archivist Meetings: Never    Marital Status: Married  Human resources officer Violence: Not At Risk (06/15/2022)   Humiliation, Afraid, Rape, and Kick questionnaire    Fear of Current or Ex-Partner: No    Emotionally Abused: No    Physically Abused: No    Sexually Abused: No      Review of Systems  HENT:  Negative for hearing loss.    Eyes:  Positive for visual disturbance (Goes to [facility] appt as last was 2 years ago and feels needs new glasses.).  Respiratory:  Negative for shortness of breath.   Cardiovascular:  Negative for chest pain, palpitations and leg swelling.  Gastrointestinal:  Negative for abdominal pain and blood in stool (No melena.).  Musculoskeletal:        Left shoulder hurts since August.  Started in both arms at elbows with reaching for plates to lift from cupboard above her head.   She went to chiropractor and had MR done, reportedly normal.   She states she had laser therapy for shoulder, but continues to have pain.  She was sent to [facility] for her low back discomfort.  Where she was given injections on both sides of low back pain.  Feels that help  Neurological:  Negative for weakness and numbness.  Psychiatric/Behavioral:  Negative for dysphoric mood. The patient is not nervous/anxious.       Objective:   BP 120/80 (BP Location: Left Arm, Patient Position: Sitting, Cuff Size: Normal)   Pulse 80   Resp 16   Ht 4' 10"$  (1.473 m)   Wt 127 lb (57.6 kg)   BMI 26.54 kg/m   Physical Exam   Assessment & Plan    CPE without pap

## 2022-06-15 NOTE — Patient Instructions (Signed)
Call to come back in when we have COVID vaccines again.

## 2022-06-16 LAB — COMPREHENSIVE METABOLIC PANEL
ALT: 30 IU/L (ref 0–32)
AST: 21 IU/L (ref 0–40)
Albumin/Globulin Ratio: 1.8 (ref 1.2–2.2)
Albumin: 4.7 g/dL (ref 3.9–4.9)
Alkaline Phosphatase: 127 IU/L — ABNORMAL HIGH (ref 44–121)
BUN/Creatinine Ratio: 15 (ref 9–23)
BUN: 9 mg/dL (ref 6–24)
Bilirubin Total: <0.2 mg/dL (ref 0.0–1.2)
CO2: 24 mmol/L (ref 20–29)
Calcium: 9.6 mg/dL (ref 8.7–10.2)
Chloride: 102 mmol/L (ref 96–106)
Creatinine, Ser: 0.61 mg/dL (ref 0.57–1.00)
Globulin, Total: 2.6 g/dL (ref 1.5–4.5)
Glucose: 98 mg/dL (ref 70–99)
Potassium: 4 mmol/L (ref 3.5–5.2)
Sodium: 141 mmol/L (ref 134–144)
Total Protein: 7.3 g/dL (ref 6.0–8.5)
eGFR: 110 mL/min/{1.73_m2} (ref >59)

## 2022-06-16 LAB — CBC WITH DIFFERENTIAL/PLATELET
Basophils Absolute: 0.1 10*3/uL (ref 0.0–0.2)
Basos: 1 %
EOS (ABSOLUTE): 0.1 10*3/uL (ref 0.0–0.4)
Eos: 1 %
Hematocrit: 42.9 % (ref 34.0–46.6)
Hemoglobin: 13.8 g/dL (ref 11.1–15.9)
Immature Grans (Abs): 0 10*3/uL (ref 0.0–0.1)
Immature Granulocytes: 0 %
Lymphocytes Absolute: 3.1 10*3/uL (ref 0.7–3.1)
Lymphs: 43 %
MCH: 28.3 pg (ref 26.6–33.0)
MCHC: 32.2 g/dL (ref 31.5–35.7)
MCV: 88 fL (ref 79–97)
Monocytes Absolute: 0.6 10*3/uL (ref 0.1–0.9)
Monocytes: 8 %
Neutrophils Absolute: 3.4 10*3/uL (ref 1.4–7.0)
Neutrophils: 47 %
Platelets: 347 10*3/uL (ref 150–450)
RBC: 4.87 x10E6/uL (ref 3.77–5.28)
RDW: 13.1 % (ref 11.7–15.4)
WBC: 7.2 10*3/uL (ref 3.4–10.8)

## 2022-06-16 LAB — LIPID PANEL W/O CHOL/HDL RATIO
Cholesterol, Total: 179 mg/dL (ref 100–199)
HDL: 33 mg/dL — ABNORMAL LOW (ref >39)
LDL Chol Calc (NIH): 94 mg/dL (ref 0–99)
Triglycerides: 313 mg/dL — ABNORMAL HIGH (ref 0–149)
VLDL Cholesterol Cal: 52 mg/dL — ABNORMAL HIGH (ref 5–40)

## 2022-06-16 LAB — HGB A1C W/O EAG: Hgb A1c MFr Bld: 6 % — ABNORMAL HIGH (ref 4.8–5.6)

## 2022-06-17 ENCOUNTER — Encounter: Payer: BLUE CROSS/BLUE SHIELD | Admitting: Internal Medicine

## 2022-06-24 ENCOUNTER — Other Ambulatory Visit: Payer: BLUE CROSS/BLUE SHIELD | Admitting: Internal Medicine

## 2022-09-26 ENCOUNTER — Telehealth: Payer: Self-pay | Admitting: Internal Medicine

## 2022-09-26 NOTE — Telephone Encounter (Signed)
Voicemail left asking to return our call 

## 2022-09-28 NOTE — Telephone Encounter (Signed)
Patient reported that the pain is minimal and feels more like discomfort. Patient does have [employer] and would like to be referred to a specialist.

## 2023-01-16 ENCOUNTER — Encounter: Payer: BLUE CROSS/BLUE SHIELD | Admitting: Internal Medicine

## 2023-01-16 ENCOUNTER — Ambulatory Visit: Payer: BLUE CROSS/BLUE SHIELD | Admitting: Internal Medicine

## 2023-01-16 DIAGNOSIS — Z23 Encounter for immunization: Secondary | ICD-10-CM | POA: Diagnosis not present

## 2023-02-03 ENCOUNTER — Encounter: Payer: Self-pay | Admitting: Internal Medicine

## 2023-05-31 ENCOUNTER — Encounter: Payer: BLUE CROSS/BLUE SHIELD | Admitting: Internal Medicine

## 2023-06-19 ENCOUNTER — Ambulatory Visit: Payer: BLUE CROSS/BLUE SHIELD | Admitting: Internal Medicine

## 2023-06-19 ENCOUNTER — Encounter: Payer: BLUE CROSS/BLUE SHIELD | Admitting: Internal Medicine

## 2023-11-07 ENCOUNTER — Telehealth: Payer: Self-pay | Admitting: Internal Medicine

## 2023-11-07 NOTE — Telephone Encounter (Signed)
 Patient had to reschedule her CPE appointment as she will be out of town . Patient would like to be on wait list for a sooner CPE appointment.  We will call patient if there is a cancellation

## 2023-11-09 ENCOUNTER — Encounter: Payer: BLUE CROSS/BLUE SHIELD | Admitting: Internal Medicine

## 2023-11-15 NOTE — Telephone Encounter (Signed)
Called patient to offer appointment, patient did not answer.

## 2023-11-17 NOTE — Telephone Encounter (Signed)
Called patient to offer appointment, patient did not answer.

## 2024-05-28 ENCOUNTER — Encounter: Admitting: Internal Medicine

## 2024-10-30 ENCOUNTER — Encounter: Admitting: Internal Medicine
